# Patient Record
Sex: Female | Born: 1965 | Race: White | Hispanic: No | Marital: Married | Smoking: Never smoker
Health system: Southern US, Community
[De-identification: ages and names within clinical notes are randomized; demographics above are authoritative.]

## PROBLEM LIST (undated history)

## (undated) DIAGNOSIS — K219 Gastro-esophageal reflux disease without esophagitis: Secondary | ICD-10-CM

## (undated) HISTORY — PX: HERNIA REPAIR: SHX51

---

## 1998-05-17 ENCOUNTER — Inpatient Hospital Stay (HOSPITAL_COMMUNITY): Admission: AD | Admit: 1998-05-17 | Discharge: 1998-05-19 | Payer: Self-pay | Admitting: Obstetrics & Gynecology

## 1998-06-21 ENCOUNTER — Other Ambulatory Visit: Admission: RE | Admit: 1998-06-21 | Discharge: 1998-06-21 | Payer: Self-pay | Admitting: Obstetrics & Gynecology

## 1999-08-13 ENCOUNTER — Other Ambulatory Visit: Admission: RE | Admit: 1999-08-13 | Discharge: 1999-08-13 | Payer: Self-pay | Admitting: *Deleted

## 2002-12-22 ENCOUNTER — Other Ambulatory Visit: Admission: RE | Admit: 2002-12-22 | Discharge: 2002-12-22 | Payer: Self-pay | Admitting: *Deleted

## 2004-04-10 ENCOUNTER — Other Ambulatory Visit: Admission: RE | Admit: 2004-04-10 | Discharge: 2004-04-10 | Payer: Self-pay | Admitting: Internal Medicine

## 2005-04-14 ENCOUNTER — Other Ambulatory Visit: Admission: RE | Admit: 2005-04-14 | Discharge: 2005-04-14 | Payer: Self-pay | Admitting: Internal Medicine

## 2007-02-14 ENCOUNTER — Other Ambulatory Visit: Admission: RE | Admit: 2007-02-14 | Discharge: 2007-02-14 | Payer: Self-pay | Admitting: Family Medicine

## 2009-01-30 ENCOUNTER — Other Ambulatory Visit: Admission: RE | Admit: 2009-01-30 | Discharge: 2009-01-30 | Payer: Self-pay | Admitting: Family Medicine

## 2009-06-20 ENCOUNTER — Other Ambulatory Visit: Admission: RE | Admit: 2009-06-20 | Discharge: 2009-06-20 | Payer: Self-pay | Admitting: Radiology

## 2011-01-29 ENCOUNTER — Other Ambulatory Visit (HOSPITAL_COMMUNITY)
Admission: RE | Admit: 2011-01-29 | Discharge: 2011-01-29 | Disposition: A | Discharge status: Home / Self Care | Payer: BC Managed Care – PPO | Source: Ambulatory Visit | Attending: Internal Medicine | Admitting: Internal Medicine

## 2011-01-29 ENCOUNTER — Other Ambulatory Visit: Payer: Self-pay

## 2011-01-29 DIAGNOSIS — Z01419 Encounter for gynecological examination (general) (routine) without abnormal findings: Secondary | ICD-10-CM | POA: Insufficient documentation

## 2011-03-02 ENCOUNTER — Other Ambulatory Visit: Payer: Self-pay | Admitting: Obstetrics and Gynecology

## 2014-11-05 ENCOUNTER — Other Ambulatory Visit: Payer: Self-pay

## 2014-11-05 ENCOUNTER — Other Ambulatory Visit (HOSPITAL_COMMUNITY)
Admission: RE | Admit: 2014-11-05 | Discharge: 2014-11-05 | Disposition: A | Discharge status: Home / Self Care | Payer: BLUE CROSS/BLUE SHIELD | Source: Ambulatory Visit | Attending: Family Medicine | Admitting: Family Medicine

## 2014-11-05 DIAGNOSIS — Z01419 Encounter for gynecological examination (general) (routine) without abnormal findings: Secondary | ICD-10-CM | POA: Diagnosis not present

## 2014-11-07 LAB — CYTOLOGY - PAP

## 2018-03-17 ENCOUNTER — Other Ambulatory Visit: Payer: Self-pay | Admitting: Obstetrics and Gynecology

## 2018-03-17 ENCOUNTER — Other Ambulatory Visit (HOSPITAL_COMMUNITY)
Admission: RE | Admit: 2018-03-17 | Discharge: 2018-03-17 | Disposition: A | Discharge status: Home / Self Care | Payer: BC Managed Care – PPO | Source: Ambulatory Visit | Attending: Obstetrics and Gynecology | Admitting: Obstetrics and Gynecology

## 2018-03-17 DIAGNOSIS — Z01411 Encounter for gynecological examination (general) (routine) with abnormal findings: Secondary | ICD-10-CM | POA: Diagnosis present

## 2018-03-18 LAB — CYTOLOGY - PAP
Diagnosis: NEGATIVE
HPV: NOT DETECTED

## 2019-08-11 HISTORY — PX: WISDOM TOOTH EXTRACTION: SHX21

## 2020-08-10 HISTORY — PX: IMPACTED THIRD MOLAR REMOVAL: SHX1790

## 2020-10-10 ENCOUNTER — Ambulatory Visit
Admission: EM | Admit: 2020-10-10 | Discharge: 2020-10-10 | Disposition: A | Discharge status: Home / Self Care | Payer: BC Managed Care – PPO | Attending: Emergency Medicine | Admitting: Emergency Medicine

## 2020-10-10 ENCOUNTER — Other Ambulatory Visit: Payer: Self-pay

## 2020-10-10 ENCOUNTER — Encounter: Payer: Self-pay | Admitting: Emergency Medicine

## 2020-10-10 DIAGNOSIS — J028 Acute pharyngitis due to other specified organisms: Secondary | ICD-10-CM

## 2020-10-10 DIAGNOSIS — B9789 Other viral agents as the cause of diseases classified elsewhere: Secondary | ICD-10-CM | POA: Diagnosis not present

## 2020-10-10 HISTORY — DX: Gastro-esophageal reflux disease without esophagitis: K21.9

## 2020-10-10 LAB — POCT RAPID STREP A (OFFICE): Rapid Strep A Screen: NEGATIVE

## 2020-10-10 MED ORDER — LIDOCAINE VISCOUS HCL 2 % MT SOLN: 15.0000 mL | OROMUCOSAL | 1 refills | Status: DC | PRN

## 2020-10-10 NOTE — ED Triage Notes (Signed)
Sore throat started yesterday.  Started with sore muscles in neck in shoulders a couple of days ago.

## 2020-10-10 NOTE — Discharge Instructions (Addendum)
Strep test negative, will send out for culture and we will call you with results Get plenty of rest and push fluids Viscous lidocaine prescribed.  This is an oral solution you can swish, and gargle as needed for symptomatic relief of sore throat.  Do not exceed 8 doses in a 24 hour period.  Do not use prior to eating, as this will numb your entire mouth.   Drink warm or cool liquids, use throat lozenges, or popsicles to help alleviate symptoms Use throat lozenges such as Halls, Vicks to soothe throat Use OTC  Cepacol or Chloraseptic to numb throat Gargle with salty warm water daily Take OTC ibuprofen or tylenol as needed for pain Follow up with PCP if symptoms persists Return or go to ER if patient has any new or worsening symptoms such as fever, chills, nausea, vomiting, worsening sore throat, cough, abdominal pain, chest pain, changes in bowel or bladder habits, etc..Marland Kitchen

## 2020-10-10 NOTE — ED Provider Notes (Signed)
[facility]   [id number] 10/10/20 Arrival Time: 0940  TG:YBWL THROAT  SUBJECTIVE: History from: patient.  Lacey Morse is a 55 y.o. female who presented to the urgent care with a complaint of sore throat and neck pain shoulder pain that started yesterday.  Denies sick exposure to COVID, strep, flu or mono, or precipitating event.  Has tried OTC medication without relief.  Symptoms are made worse with swallowing, but tolerating liquids and own secretions without difficulty.  Denies previous symptoms in the past.   Denies fever, chills, fatigue, ear pain, sinus pain, rhinorrhea, nasal congestion, cough, SOB, wheezing, chest pain, nausea, rash, changes in bowel or bladder habits.      ROS: As per HPI.  All other pertinent ROS negative.     Past Medical History:  Diagnosis Date  . GERD (gastroesophageal reflux disease)    Past Surgical History:  Procedure Laterality Date  . HERNIA REPAIR     No Known Allergies No current facility-administered medications on file prior to encounter.   Current Outpatient Medications on File Prior to Encounter  Medication Sig Dispense Refill  . omeprazole (PRILOSEC) 20 MG capsule Take 20 mg by mouth daily.     Social History   Socioeconomic History  . Marital status: Married    Spouse name: Not on file  . Number of children: Not on file  . Years of education: Not on file  . Highest education level: Not on file  Occupational History  . Not on file  Tobacco Use  . Smoking status: Never Smoker  . Smokeless tobacco: Never Used  Substance and Sexual Activity  . Alcohol use: Never  . Drug use: Never  . Sexual activity: Not on file  Other Topics Concern  . Not on file  Social History Narrative  . Not on file   Social Determinants of Health   Financial Resource Strain: Not on file  Food Insecurity: Not on file  Transportation Needs: Not on file  Physical Activity: Not on file  Stress: Not on file  Social Connections: Not on  file  Intimate Partner Violence: Not on file   History reviewed. No pertinent family history.  OBJECTIVE:  Vitals:   10/10/20 0953 10/10/20 0955  BP:  115/70  Pulse:  89  Resp:  18  Temp:  98.4 F (36.9 C)  TempSrc:  Oral  SpO2:  96%  Weight: 128 lb (58.1 kg)   Height: 5\' 2"  (1.575 m)      General appearance: alert; appears fatigued, but nontoxic, speaking in full sentences and managing own secretions HEENT: NCAT; Ears: EACs clear, TMs pearly gray with visible cone of light, without erythema; Eyes: PERRL, EOMI grossly; Nose: no obvious rhinorrhea; Throat: oropharynx clear, tonsils 1+ and mildly erythematous without white tonsillar exudates, uvula midline Neck: supple without LAD Lungs: CTA bilaterally without adventitious breath sounds; cough absent Heart: regular rate and rhythm.  Radial pulses 2+ symmetrical bilaterally Skin: warm and dry Psychological: alert and cooperative; normal mood and affect  LABS: Results for orders placed or performed during the hospital encounter of 10/10/20 (from the past 24 hour(s))  POCT rapid strep A     Status: None   Collection Time: 10/10/20 10:05 AM  Result Value Ref Range   Rapid Strep A Screen Negative Negative     ASSESSMENT & PLAN:  1. Sore throat (viral)     Meds ordered this encounter  Medications  . lidocaine (XYLOCAINE) 2 % solution    Sig: Use  as directed 15 mLs in the mouth or throat as needed for mouth pain.    Dispense:  100 mL    Refill:  1    Discharge instructions  Strep test negative, will send out for culture and we will call you with results Get plenty of rest and push fluids Viscous lidocaine prescribed.  This is an oral solution you can swish, and gargle as needed for symptomatic relief of sore throat.  Do not exceed 8 doses in a 24 hour period.  Do not use prior to eating, as this will numb your entire mouth.   Drink warm or cool liquids, use throat lozenges, or popsicles to help alleviate symptoms Use  throat lozenges such as Halls, Vicks to soothe throat Use OTC  Cepacol or Chloraseptic to numb throat Gargle with salty warm water daily Take OTC ibuprofen or tylenol as needed for pain Follow up with PCP if symptoms persists Return or go to ER if patient has any new or worsening symptoms such as fever, chills, nausea, vomiting, worsening sore throat, cough, abdominal pain, chest pain, changes in bowel or bladder habits, etc...  Reviewed expectations re: course of current medical issues. Questions answered. Outlined signs and symptoms indicating need for more acute intervention. Patient verbalized understanding. After Visit Summary given.         Durward Parcel, FNP 10/10/20 1017

## 2020-10-13 LAB — CULTURE, GROUP A STREP (THRC)

## 2021-04-17 ENCOUNTER — Ambulatory Visit (HOSPITAL_COMMUNITY): Payer: BC Managed Care – PPO | Attending: Sports Medicine | Admitting: Occupational Therapy

## 2021-04-17 ENCOUNTER — Other Ambulatory Visit: Payer: Self-pay

## 2021-04-17 ENCOUNTER — Encounter (HOSPITAL_COMMUNITY): Payer: Self-pay | Admitting: Occupational Therapy

## 2021-04-17 DIAGNOSIS — M25512 Pain in left shoulder: Secondary | ICD-10-CM | POA: Diagnosis present

## 2021-04-17 DIAGNOSIS — M25612 Stiffness of left shoulder, not elsewhere classified: Secondary | ICD-10-CM

## 2021-04-17 DIAGNOSIS — G8929 Other chronic pain: Secondary | ICD-10-CM | POA: Diagnosis present

## 2021-04-17 DIAGNOSIS — R29898 Other symptoms and signs involving the musculoskeletal system: Secondary | ICD-10-CM | POA: Insufficient documentation

## 2021-04-17 NOTE — Patient Instructions (Signed)
  1) Flexion Wall Stretch    Face wall, place affected handon wall in front of you. Slide hand up the wall  and lean body in towards the wall. Hold for 10 seconds. Repeat 3-5 times. 1-2 times/day.     2) Towel Stretch with Internal Rotation   Or     Gently pull up (or to the side) your affected arm  behind your back with the assist of a towel. Hold 10 seconds, repeat 3-5 times. 1-2 times/day.             3) Corner Stretch    Stand at a corner of a wall, place your arms on the walls with elbows bent. Lean into the corner until a stretch is felt along the front of your chest and/or shoulders. Hold for 10 seconds. Repeat 3-5X, 1-2 times/day.    4) Posterior Capsule Stretch    Bring the involved arm across chest. Grasp elbow and pull toward chest until you feel a stretch in the back of the upper arm and shoulder. Hold 10 seconds. Repeat 3-5X. Complete 1-2 times/day.    5) Scapular Retraction    Tuck chin back as you pinch shoulder blades together.  Hold 5 seconds. Repeat 3-5X. Complete 1-2 times/day.    6) External Rotation Stretch:     Place your affected hand on the wall with the elbow bent and gently turn your body the opposite direction until a stretch is felt. Hold 10 seconds, repeat 3-5X. Complete 1-2 times/day.    7) Shoulder Abduction stretch:     Slide your hand up the wall as far as you can, then take a step closer to the wall to slide it further. Hold briefly, then slowly lower it back down and repeat. Hold 10 seconds, repeat 3-5X. Complete 1-2 times/day.

## 2021-04-17 NOTE — Therapy (Signed)
[facility] [address] [place], [place], [address] Phone: [phone]   Fax:  [phone]  Occupational Therapy Evaluation  Patient Details  Name: Lacey Morse MRN: [id number] Date of Birth: 1966/01/04 Referring Provider (OT): Dr. Christena Deem   Encounter Date: 04/17/2021   OT End of Session - 04/17/21 0940     Visit Number 1    Number of Visits 4    Date for OT Re-Evaluation 05/17/21    Authorization Type [facility], $25 copay    Authorization Time Period no visit limit    OT Start Time 0904    OT Stop Time 0938    OT Time Calculation (min) 34 min    Activity Tolerance Patient tolerated treatment well    Behavior During Therapy [facility] for tasks assessed/performed             Past Medical History:  Diagnosis Date   GERD (gastroesophageal reflux disease)     Past Surgical History:  Procedure Laterality Date   HERNIA REPAIR      There were no vitals filed for this visit.   Subjective Assessment - 04/17/21 0929     Subjective  S: I've been doing the exercises the doctor gave me.    Pertinent History Pt is a 55 y/o female presenting with left shoulder pain that began in February 2022. Pt reports MD believes she could be developing frozen shoulder and she received a cortisone shot in May, however did not have relief from the shot. Pt was referred to occupational therapy for evaluation and treatment by Dr. Christena Deem.    Special Tests FOTO: 58/100    Patient Stated Goals To have less pain in my arm.    Currently in Pain? No/denies   has pain with quick movements              [facility] OT Assessment - 04/17/21 0856       Assessment   Medical Diagnosis left shoulder pain    Referring Provider (OT) Dr. Christena Deem    Onset Date/Surgical Date 09/10/20    Hand Dominance Right    Prior Therapy None for this issue      Precautions   Precautions None      Restrictions   Weight Bearing Restrictions No       Balance Screen   Has the patient fallen in the past 6 months Yes    How many times? 1    Has the patient had a decrease in activity level because of a fear of falling?  No    Is the patient reluctant to leave their home because of a fear of falling?  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 Lifting weighted items is difficult at times. Sleeps on the sofa due to discomfort      Observation/Other Assessments   Focus on Therapeutic Outcomes (FOTO)  58/100      ROM / Strength   AROM / PROM / Strength AROM;Strength;PROM      Palpation   Palpation comment mod fascial restrictions along upper arm, trapezius, and scapular regions      AROM   Overall AROM Comments Assessed seated,  er/IR adducted    AROM Assessment Site Shoulder    Right/Left Shoulder Left    Left Shoulder Flexion 151 Degrees    Left Shoulder ABduction 102 Degrees    Left Shoulder Internal Rotation 90 Degrees    Left Shoulder External Rotation 63 Degrees      PROM   Overall PROM Comments Assessed supine, er/IR adducted    PROM Assessment Site Shoulder    Right/Left Shoulder Left    Left Shoulder Flexion 153 Degrees    Left Shoulder ABduction 180 Degrees    Left Shoulder Internal Rotation 90 Degrees    Left Shoulder External Rotation 65 Degrees      Strength   Overall Strength Comments Assessed seated, er/IR adducted    Strength Assessment Site Shoulder    Right/Left Shoulder Left    Left Shoulder Flexion 5/5    Left Shoulder ABduction 5/5    Left Shoulder Internal Rotation 5/5    Left Shoulder External Rotation 5/5                              OT Education - 04/17/21 0939     Education Details shoulder stretches    Person(s) Educated  Patient    Methods Explanation;Demonstration;Handout    Comprehension Verbalized understanding;Returned demonstration              OT Short Term Goals - 04/17/21 0943       OT SHORT TERM GOAL #1   Title Pt will be provided with and educated on HEP to improve mobility of LUE required for use during ADL tasks.    Time 4    Period Weeks    Status New    Target Date 05/17/21      OT SHORT TERM GOAL #2   Title Pt will decrease pain in LUE to 2/10 or less to improve ability to sleep in the bed versus on the sofa.    Time 4    Period Weeks    Status New      OT SHORT TERM GOAL #3   Title Pt will decrease fascial restrictions in LUE to min amounts to improve mobility required for functional reaching tasks.    Time 4    Period Weeks    Status New      OT SHORT TERM GOAL #4   Title Pt will increase LUE A/ROM to WNL to improve ability to perform dressing and bathing tasks without compensatory techniques.    Time 4    Period Weeks    Status New                      Plan - 04/17/21 0940     Clinical Impression Statement A: Pt is a 55 y/o female presenting with left shoulder pain present since February 2022, presents with limitations on functional use of LUE during ADLs and work tasks. Pt reports diligence in HEP completion from MD, she is using her arm for ADLs and trying to keep it mobile.    OT Occupational Profile and History Problem Focused Assessment - Including review of records relating to presenting problem    Occupational performance deficits (Please refer to evaluation for details): ADL's;IADL's;Rest and Sleep;Work;Leisure    Body Structure / Function / Physical Skills ADL;Endurance;UE functional use;Fascial restriction;Pain;ROM;IADL;Strength    Rehab Potential Good    Clinical Decision Making Limited treatment options, no task modification necessary  Comorbidities Affecting Occupational Performance: None    Modification or Assistance to Complete Evaluation   No modification of tasks or assist necessary to complete eval    OT Frequency 1x / week    OT Duration 4 weeks    OT Treatment/Interventions Self-care/ADL training;Ultrasound;DME and/or AE instruction;Patient/family education;Passive range of motion;Cryotherapy;Electrical Stimulation;Moist Heat;Therapeutic exercise;Manual Therapy;Therapeutic activities    Plan P: Pt will benefit from skilled OT services to decrease pain and fascial restrictions, increase joint ROM, strength, and functional use of LUE. Treatment plan: myofascial release, manual techniques, P/ROM, A/ROM, general LUE strengthening, scapular mobility/stability/strengthening, modalities prn    OT Home Exercise Plan eval: shoulder stretches    Consulted and Agree with Plan of Care Patient             Patient will benefit from skilled therapeutic intervention in order to improve the following deficits and impairments:   Body Structure / Function / Physical Skills: ADL, Endurance, UE functional use, Fascial restriction, Pain, ROM, IADL, Strength       Visit Diagnosis: Chronic left shoulder pain  Other symptoms and signs involving the musculoskeletal system  Stiffness of left shoulder, not elsewhere classified    Problem List There are no problems to display for this patient.   Ezra Sites, OTR/L  [phone] 04/17/2021, 9:49 AM  [facility] [address] [place], [place], [address] Phone: [phone]   Fax:  [phone]  Name: Lacey Morse MRN: [id number] Date of Birth: Jul 14, 1966

## 2021-04-22 ENCOUNTER — Other Ambulatory Visit: Payer: Self-pay | Admitting: Sports Medicine

## 2021-04-22 ENCOUNTER — Ambulatory Visit: Payer: BC Managed Care – PPO | Admitting: Neurology

## 2021-04-22 ENCOUNTER — Ambulatory Visit
Admission: RE | Admit: 2021-04-22 | Discharge: 2021-04-22 | Disposition: A | Discharge status: Home / Self Care | Payer: BC Managed Care – PPO | Source: Ambulatory Visit | Attending: Sports Medicine | Admitting: Sports Medicine

## 2021-04-22 DIAGNOSIS — M25512 Pain in left shoulder: Secondary | ICD-10-CM

## 2021-04-24 ENCOUNTER — Encounter (HOSPITAL_COMMUNITY): Payer: Self-pay | Admitting: Occupational Therapy

## 2021-04-24 ENCOUNTER — Ambulatory Visit (HOSPITAL_COMMUNITY): Payer: BC Managed Care – PPO | Admitting: Occupational Therapy

## 2021-04-24 ENCOUNTER — Other Ambulatory Visit: Payer: Self-pay

## 2021-04-24 DIAGNOSIS — M25612 Stiffness of left shoulder, not elsewhere classified: Secondary | ICD-10-CM

## 2021-04-24 DIAGNOSIS — R29898 Other symptoms and signs involving the musculoskeletal system: Secondary | ICD-10-CM

## 2021-04-24 DIAGNOSIS — M25512 Pain in left shoulder: Secondary | ICD-10-CM

## 2021-04-24 DIAGNOSIS — G8929 Other chronic pain: Secondary | ICD-10-CM

## 2021-04-24 NOTE — Therapy (Signed)
[facility] [address] [place], [place], [address] Phone: [phone]   Fax:  [phone]  Occupational Therapy Treatment  Patient Details  Name: Lacey Morse MRN: [id number] Date of Birth: 25-Feb-1966 Referring Provider (OT): Dr. Christena Deem   Encounter Date: 04/24/2021   OT End of Session - 04/24/21 1202     Visit Number 2    Number of Visits 4    Date for OT Re-Evaluation 05/17/21    Authorization Type [facility], $25 copay    Authorization Time Period no visit limit    OT Start Time 1119    OT Stop Time 1159    OT Time Calculation (min) 40 min    Activity Tolerance Patient tolerated treatment well    Behavior During Therapy [facility] for tasks assessed/performed             Past Medical History:  Diagnosis Date   GERD (gastroesophageal reflux disease)     Past Surgical History:  Procedure Laterality Date   HERNIA REPAIR      There were no vitals filed for this visit.   Subjective Assessment - 04/24/21 1121     Subjective  S: I can tell a big difference in my ROM.    Currently in Pain? No/denies                [facility] OT Assessment - 04/24/21 1120       Assessment   Medical Diagnosis left shoulder pain      Precautions   Precautions None                      OT Treatments/Exercises (OP) - 04/24/21 1121       Exercises   Exercises Shoulder      Shoulder Exercises: Supine   Protraction PROM;5 reps;AROM;10 reps    Horizontal ABduction PROM;5 reps;AROM;10 reps    External Rotation PROM;5 reps;AROM;10 reps    Internal Rotation PROM;5 reps;AROM;10 reps    Flexion PROM;5 reps;AROM;10 reps    ABduction PROM;5 reps;AROM;10 reps      Shoulder Exercises: Standing   Extension Theraband;10 reps    Theraband Level (Shoulder Extension) Level 2 (Red)    Row Theraband;10 reps    Theraband Level (Shoulder Row) Level 2 (Red)    Retraction Theraband;10 reps    Theraband Level (Shoulder  Retraction) Level 2 (Red)      Shoulder Exercises: ROM/Strengthening   UBE (Upper Arm Bike) Level 1 3' forward 3' reverse, pace: 5.5    Over Head Lace 2' seated      Manual Therapy   Manual Therapy Myofascial release    Manual therapy comments completed separately from therapeutic exercises    Myofascial Release myofascial release to left upper arm, trapezius, and scapular region to decrease pain and fascial restrictions and increase joint ROM.                      OT Short Term Goals - 04/24/21 1135       OT SHORT TERM GOAL #1   Title Pt will be provided with and educated on HEP to improve mobility of LUE required for use during ADL tasks.    Time 4    Period Weeks    Status On-going    Target Date 05/17/21      OT SHORT TERM GOAL #2   Title Pt will decrease pain in LUE to 2/10 or less  to improve ability to sleep in the bed versus on the sofa.    Time 4    Period Weeks    Status On-going      OT SHORT TERM GOAL #3   Title Pt will decrease fascial restrictions in LUE to min amounts to improve mobility required for functional reaching tasks.    Time 4    Period Weeks    Status On-going      OT SHORT TERM GOAL #4   Title Pt will increase LUE A/ROM to WNL to improve ability to perform dressing and bathing tasks without compensatory techniques.    Time 4    Period Weeks    Status On-going                      Plan - 04/24/21 1157     Clinical Impression Statement A: Pt reports greater ease completing dressing tasks, reaching tasks at home since evaluation. Pt with great improvement in ROM, WFL in all ranges. Initiated myofascial release to address fascial restrictions today, A/ROM in supine, scapular theraband, overhead lacing, and UBE. Verbal cuing for form and technique.    Body Structure / Function / Physical Skills ADL;Endurance;UE functional use;Fascial restriction;Pain;ROM;IADL;Strength    Plan P: Follow up on HEP, complete A/ROM in standing,  functional reaching task    OT Home Exercise Plan eval: shoulder stretches; 9/15: red scapular theraband    Consulted and Agree with Plan of Care Patient             Patient will benefit from skilled therapeutic intervention in order to improve the following deficits and impairments:   Body Structure / Function / Physical Skills: ADL, Endurance, UE functional use, Fascial restriction, Pain, ROM, IADL, Strength       Visit Diagnosis: Chronic left shoulder pain  Other symptoms and signs involving the musculoskeletal system  Stiffness of left shoulder, not elsewhere classified    Problem List There are no problems to display for this patient.   Ezra Sites, OTR/L  [phone] 04/24/2021, 12:03 PM  [facility] [address] [place], [place], [address] Phone: [phone]   Fax:  [phone]  Name: Lacey Morse MRN: [id number] Date of Birth: 02-06-1966

## 2021-04-24 NOTE — Patient Instructions (Signed)

## 2021-05-01 ENCOUNTER — Encounter (HOSPITAL_COMMUNITY): Payer: Self-pay | Admitting: Occupational Therapy

## 2021-05-01 ENCOUNTER — Ambulatory Visit (HOSPITAL_COMMUNITY): Payer: BC Managed Care – PPO | Admitting: Occupational Therapy

## 2021-05-01 ENCOUNTER — Other Ambulatory Visit: Payer: Self-pay

## 2021-05-01 DIAGNOSIS — M25512 Pain in left shoulder: Secondary | ICD-10-CM | POA: Diagnosis not present

## 2021-05-01 DIAGNOSIS — G8929 Other chronic pain: Secondary | ICD-10-CM

## 2021-05-01 DIAGNOSIS — R29898 Other symptoms and signs involving the musculoskeletal system: Secondary | ICD-10-CM

## 2021-05-01 DIAGNOSIS — M25612 Stiffness of left shoulder, not elsewhere classified: Secondary | ICD-10-CM

## 2021-05-01 NOTE — Therapy (Signed)
[facility] [address] [place], [place], [address] Phone: [phone]   Fax:  [phone]  Occupational Therapy Treatment  Patient Details  Name: Lacey Morse MRN: [id number] Date of Birth: April 18, 1966 Referring Provider (OT): Dr. Christena Deem   Encounter Date: 05/01/2021   OT End of Session - 05/01/21 1156     Visit Number 3    Number of Visits 4    Date for OT Re-Evaluation 05/17/21    Authorization Type [facility], $25 copay    Authorization Time Period no visit limit    OT Start Time 1117    OT Stop Time 1155    OT Time Calculation (min) 38 min    Activity Tolerance Patient tolerated treatment well    Behavior During Therapy [facility] for tasks assessed/performed             Past Medical History:  Diagnosis Date   GERD (gastroesophageal reflux disease)     Past Surgical History:  Procedure Laterality Date   HERNIA REPAIR      There were no vitals filed for this visit.   Subjective Assessment - 05/01/21 1118     Subjective  S: I'm a little sore but I'm using it more.    Currently in Pain? Yes    Pain Score 4     Pain Location Shoulder    Pain Orientation Left    Pain Descriptors / Indicators Aching;Sore    Pain Type Acute pain    Pain Radiating Towards None    Pain Onset In the past 7 days    Pain Frequency Intermittent    Aggravating Factors  using it more    Pain Relieving Factors OTC pain medication    Effect of Pain on Daily Activities min effect on ADLs    Multiple Pain Sites No                [facility] OT Assessment - 05/01/21 1117       Assessment   Medical Diagnosis left shoulder pain      Precautions   Precautions None                      OT Treatments/Exercises (OP) - 05/01/21 1119       Exercises   Exercises Shoulder      Shoulder Exercises: Supine   Protraction PROM;5 reps    Horizontal ABduction PROM;5 reps    External Rotation PROM;5 reps    Internal  Rotation PROM;5 reps    Flexion PROM;5 reps    ABduction PROM;5 reps      Shoulder Exercises: Standing   Protraction Strengthening;10 reps    Protraction Weight (lbs) 1    Horizontal ABduction Strengthening;10 reps    Horizontal ABduction Weight (lbs) 1    External Rotation Strengthening;10 reps    External Rotation Weight (lbs) 1    Internal Rotation Strengthening;10 reps    Internal Rotation Weight (lbs) 1    Flexion Strengthening;10 reps    Shoulder Flexion Weight (lbs) 1    ABduction Strengthening;10 reps    Shoulder ABduction Weight (lbs) 1      Shoulder Exercises: Therapy Ball   Other Therapy Ball Exercises green therapy ball: chest press, flexion, overhead press, circles each direction, 10X each      Shoulder Exercises: ROM/Strengthening   UBE (Upper Arm Bike) Level 2 3' forward 3' reverse pace: 7.0    X to V Arms 10X  Proximal Shoulder Strengthening, Seated 10X each , no rest breaks    Ball on Wall 1' flexion 1' abduction      Manual Therapy   Manual Therapy Myofascial release    Manual therapy comments completed separately from therapeutic exercises    Myofascial Release myofascial release to left upper arm, trapezius, and scapular region to decrease pain and fascial restrictions and increase joint ROM.                      OT Short Term Goals - 04/24/21 1135       OT SHORT TERM GOAL #1   Title Pt will be provided with and educated on HEP to improve mobility of LUE required for use during ADL tasks.    Time 4    Period Weeks    Status On-going    Target Date 05/17/21      OT SHORT TERM GOAL #2   Title Pt will decrease pain in LUE to 2/10 or less to improve ability to sleep in the bed versus on the sofa.    Time 4    Period Weeks    Status On-going      OT SHORT TERM GOAL #3   Title Pt will decrease fascial restrictions in LUE to min amounts to improve mobility required for functional reaching tasks.    Time 4    Period Weeks    Status  On-going      OT SHORT TERM GOAL #4   Title Pt will increase LUE A/ROM to WNL to improve ability to perform dressing and bathing tasks without compensatory techniques.    Time 4    Period Weeks    Status On-going                      Plan - 05/01/21 1137     Clinical Impression Statement A: Pt reports soreness today, has been completing HEP and using her LUE more during the day. Continued with myofascial release to address fascial restrictions, passive stretching. Progressed to strengthening in standing using 1# weights, added therapy ball strengthening and ball on wall. Increased UBE to level 2. Verbal cuing for form and technique.    Body Structure / Function / Physical Skills ADL;Endurance;UE functional use;Fascial restriction;Pain;ROM;IADL;Strength    Plan P: Reassessment, discharge with HEP    OT Home Exercise Plan eval: shoulder stretches; 9/15: red scapular theraband    Consulted and Agree with Plan of Care Patient             Patient will benefit from skilled therapeutic intervention in order to improve the following deficits and impairments:   Body Structure / Function / Physical Skills: ADL, Endurance, UE functional use, Fascial restriction, Pain, ROM, IADL, Strength       Visit Diagnosis: Chronic left shoulder pain  Other symptoms and signs involving the musculoskeletal system  Stiffness of left shoulder, not elsewhere classified    Problem List There are no problems to display for this patient.  Ezra Sites, OTR/L  [phone] 05/01/2021, 11:57 AM  [facility] [address] [place], [place], [address] Phone: [phone]   Fax:  [phone]  Name: ADRIANE GABBERT MRN: [id number] Date of Birth: July 22, 1966

## 2021-05-08 ENCOUNTER — Other Ambulatory Visit: Payer: Self-pay

## 2021-05-08 ENCOUNTER — Ambulatory Visit (HOSPITAL_COMMUNITY): Payer: BC Managed Care – PPO | Admitting: Occupational Therapy

## 2021-05-08 ENCOUNTER — Encounter (HOSPITAL_COMMUNITY): Payer: Self-pay | Admitting: Occupational Therapy

## 2021-05-08 DIAGNOSIS — R29898 Other symptoms and signs involving the musculoskeletal system: Secondary | ICD-10-CM

## 2021-05-08 DIAGNOSIS — G8929 Other chronic pain: Secondary | ICD-10-CM

## 2021-05-08 DIAGNOSIS — M25512 Pain in left shoulder: Secondary | ICD-10-CM | POA: Diagnosis not present

## 2021-05-08 DIAGNOSIS — M25612 Stiffness of left shoulder, not elsewhere classified: Secondary | ICD-10-CM

## 2021-05-08 NOTE — Patient Instructions (Signed)

## 2021-05-08 NOTE — Therapy (Signed)
[place], [place], [address] Phone: [phone]   Fax:  [phone]  Occupational Therapy Reassessment, Treatment Discharge Summary  Patient Details  Name: Lacey Morse MRN: [id number] Date of Birth: 1966-07-17 Referring Provider (OT): Dr. Inez Catalina   Encounter Date: 05/08/2021   OT End of Session - 05/08/21 1144     Visit Number 4    Number of Visits 4    Date for OT Re-Evaluation 05/17/21    Authorization Type [facility], $25 copay    Authorization Time Period no visit limit    OT Start Time 1119    OT Stop Time 1149    OT Time Calculation (min) 30 min    Activity Tolerance Patient tolerated treatment well    Behavior During Therapy [facility] for tasks assessed/performed             Past Medical History:  Diagnosis Date   GERD (gastroesophageal reflux disease)     Past Surgical History:  Procedure Laterality Date   HERNIA REPAIR      There were no vitals filed for this visit.   Subjective Assessment - 05/08/21 1119     Subjective  S: I can do so much more than I could before.    Currently in Pain? No/denies                [facility] OT Assessment - 05/08/21 1118       Assessment   Medical Diagnosis left shoulder pain      Precautions   Precautions None      Observation/Other Assessments   Focus on Therapeutic Outcomes (FOTO)  63/100   58/100 previous     Palpation   Palpation comment min fascial restrictions along trapezius and scapular regions      AROM   Overall AROM Comments Assessed seated, er/IR adducted    AROM Assessment Site Shoulder    Right/Left Shoulder Left    Left Shoulder Flexion 160 Degrees   151 previous   Left Shoulder ABduction 180 Degrees   102 previous   Left Shoulder Internal Rotation 90 Degrees   same as previous   Left Shoulder External Rotation 65 Degrees   63 previous     PROM   Overall PROM Comments Assessed supine, er/IR adducted    PROM  Assessment Site Shoulder    Right/Left Shoulder Left    Left Shoulder Flexion 155 Degrees   153 previous   Left Shoulder ABduction 180 Degrees   same as previous   Left Shoulder Internal Rotation 90 Degrees   same as previous   Left Shoulder External Rotation 65 Degrees   same as previous     Strength   Overall Strength Comments Assessed seated, er/IR adducted    Strength Assessment Site Shoulder    Right/Left Shoulder Left    Left Shoulder Flexion 5/5   same as previous   Left Shoulder ABduction 5/5   same as previous   Left Shoulder Internal Rotation 5/5   same as previous   Left Shoulder External Rotation 5/5   same as previous                     OT Treatments/Exercises (OP) - 05/08/21 1121       Exercises   Exercises Shoulder      Shoulder Exercises: Standing   Protraction Strengthening;10 reps    Protraction Weight (lbs) 2    Horizontal ABduction  Strengthening;10 reps    Horizontal ABduction Weight (lbs) 2    External Rotation Strengthening;10 reps    External Rotation Weight (lbs) 2    Internal Rotation Strengthening;10 reps    Internal Rotation Weight (lbs) 2    Flexion Strengthening;10 reps    Shoulder Flexion Weight (lbs) 2    ABduction Strengthening;10 reps    Shoulder ABduction Weight (lbs) 2      Manual Therapy   Manual Therapy Myofascial release    Manual therapy comments completed separately from therapeutic exercises    Myofascial Release myofascial release to left upper arm, trapezius, and scapular region to decrease pain and fascial restrictions and increase joint ROM.                    OT Education - 05/08/21 1138     Education Details shoulder strengthening    Person(s) Educated Patient    Methods Explanation;Demonstration;Handout    Comprehension Verbalized understanding;Returned demonstration              OT Short Term Goals - 05/08/21 1136       OT SHORT TERM GOAL #1   Title Pt will be provided with and  educated on HEP to improve mobility of LUE required for use during ADL tasks.    Time 4    Period Weeks    Status Achieved    Target Date 05/17/21      OT SHORT TERM GOAL #2   Title Pt will decrease pain in LUE to 2/10 or less to improve ability to sleep in the bed versus on the sofa.    Time 4    Period Weeks    Status Achieved      OT SHORT TERM GOAL #3   Title Pt will decrease fascial restrictions in LUE to min amounts to improve mobility required for functional reaching tasks.    Time 4    Period Weeks    Status Achieved      OT SHORT TERM GOAL #4   Title Pt will increase LUE A/ROM to WNL to improve ability to perform dressing and bathing tasks without compensatory techniques.    Time 4    Period Weeks    Status Achieved                      Plan - 05/08/21 1141     Clinical Impression Statement A: Reassessment completed this session, pt has made great progress and has met all goals. Pt reports she is able to use her LUE for all ADLs, occasionally has pain if moving her arm to the extreme in certain planes. Pt is now able to sleep without pain, is attempting to transition back to the bed from the sofa. Pt has returned to the gym and is using all machines except the push-up machine. Progressed to 2# free weight strengthening today, updated HEP. Pt is agreeable to discharge today.    Body Structure / Function / Physical Skills ADL;Endurance;UE functional use;Fascial restriction;Pain;ROM;IADL;Strength    Plan P: Discharge pt    OT Home Exercise Plan eval: shoulder stretches; 9/15: red scapular theraband; 9/29: shoulder strengthening    Consulted and Agree with Plan of Care Patient             Patient will benefit from skilled therapeutic intervention in order to improve the following deficits and impairments:   Body Structure / Function / Physical Skills: ADL, Endurance, UE functional use, Fascial restriction,  Pain, ROM, IADL, Strength       Visit  Diagnosis: Chronic left shoulder pain  Other symptoms and signs involving the musculoskeletal system  Stiffness of left shoulder, not elsewhere classified    Problem List There are no problems to display for this patient.   Guadelupe Sabin, OTR/L  [phone] 05/08/2021, 11:52 AM  [place], [place], [address] Phone: [phone]   Fax:  [phone]  Name: Lacey Morse MRN: [id number] Date of Birth: [birth year]  OCCUPATIONAL THERAPY DISCHARGE SUMMARY  Visits from Start of Care: 4  Current functional level related to goals / functional outcomes: See above. Pt has met all goals and is ready to discharge with HEP.   Remaining deficits: Pain at end ranges of movement/stretches   Education / Equipment: HEP for shoulder stretching and strengthening   Patient agrees to discharge. Patient goals were met. Patient is being discharged due to meeting the stated rehab goals.Marland Kitchen

## 2022-02-01 IMAGING — DX DG SHOULDER 2+V*L*
3 series · 3 of 3 positions shown · non-contrast
Comparison: None.

CLINICAL DATA: Shoulder pain

EXAM:
LEFT SHOULDER - 2+ VIEW

[dg shoulder left (1 of 3)]
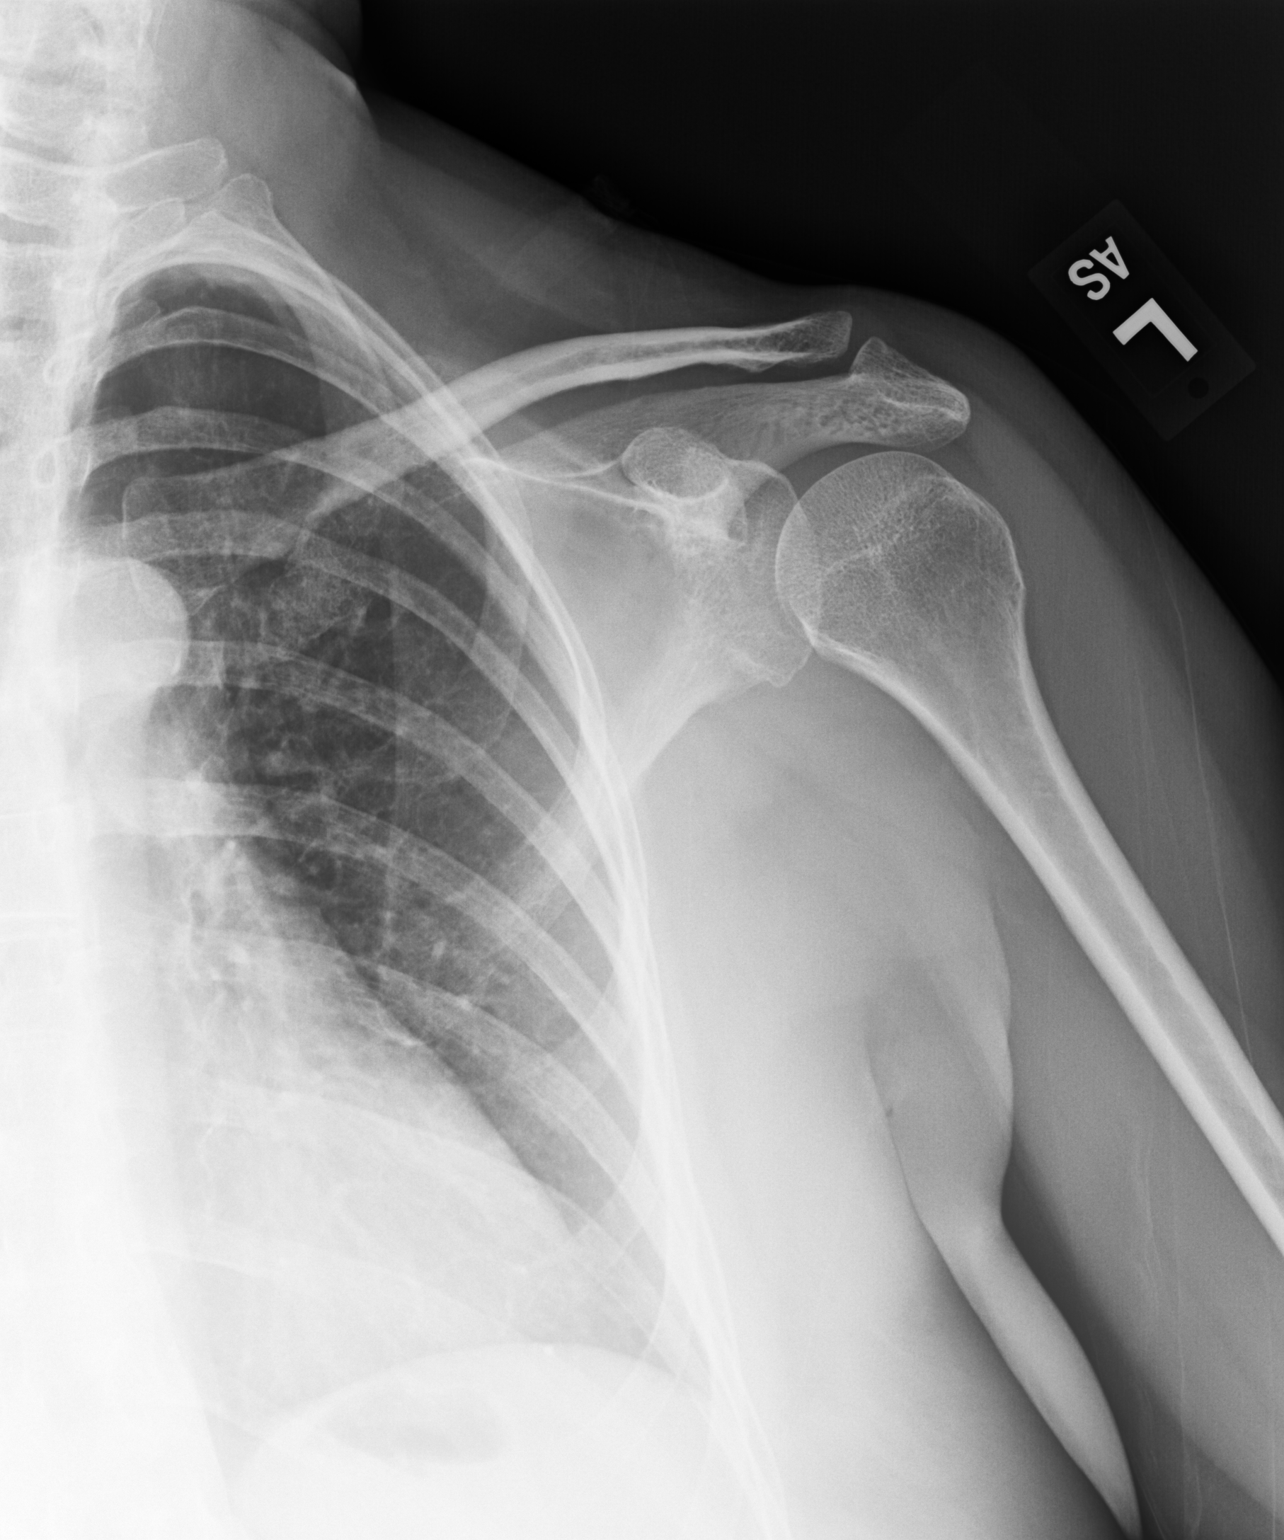

[dg shoulder left (2 of 3)]
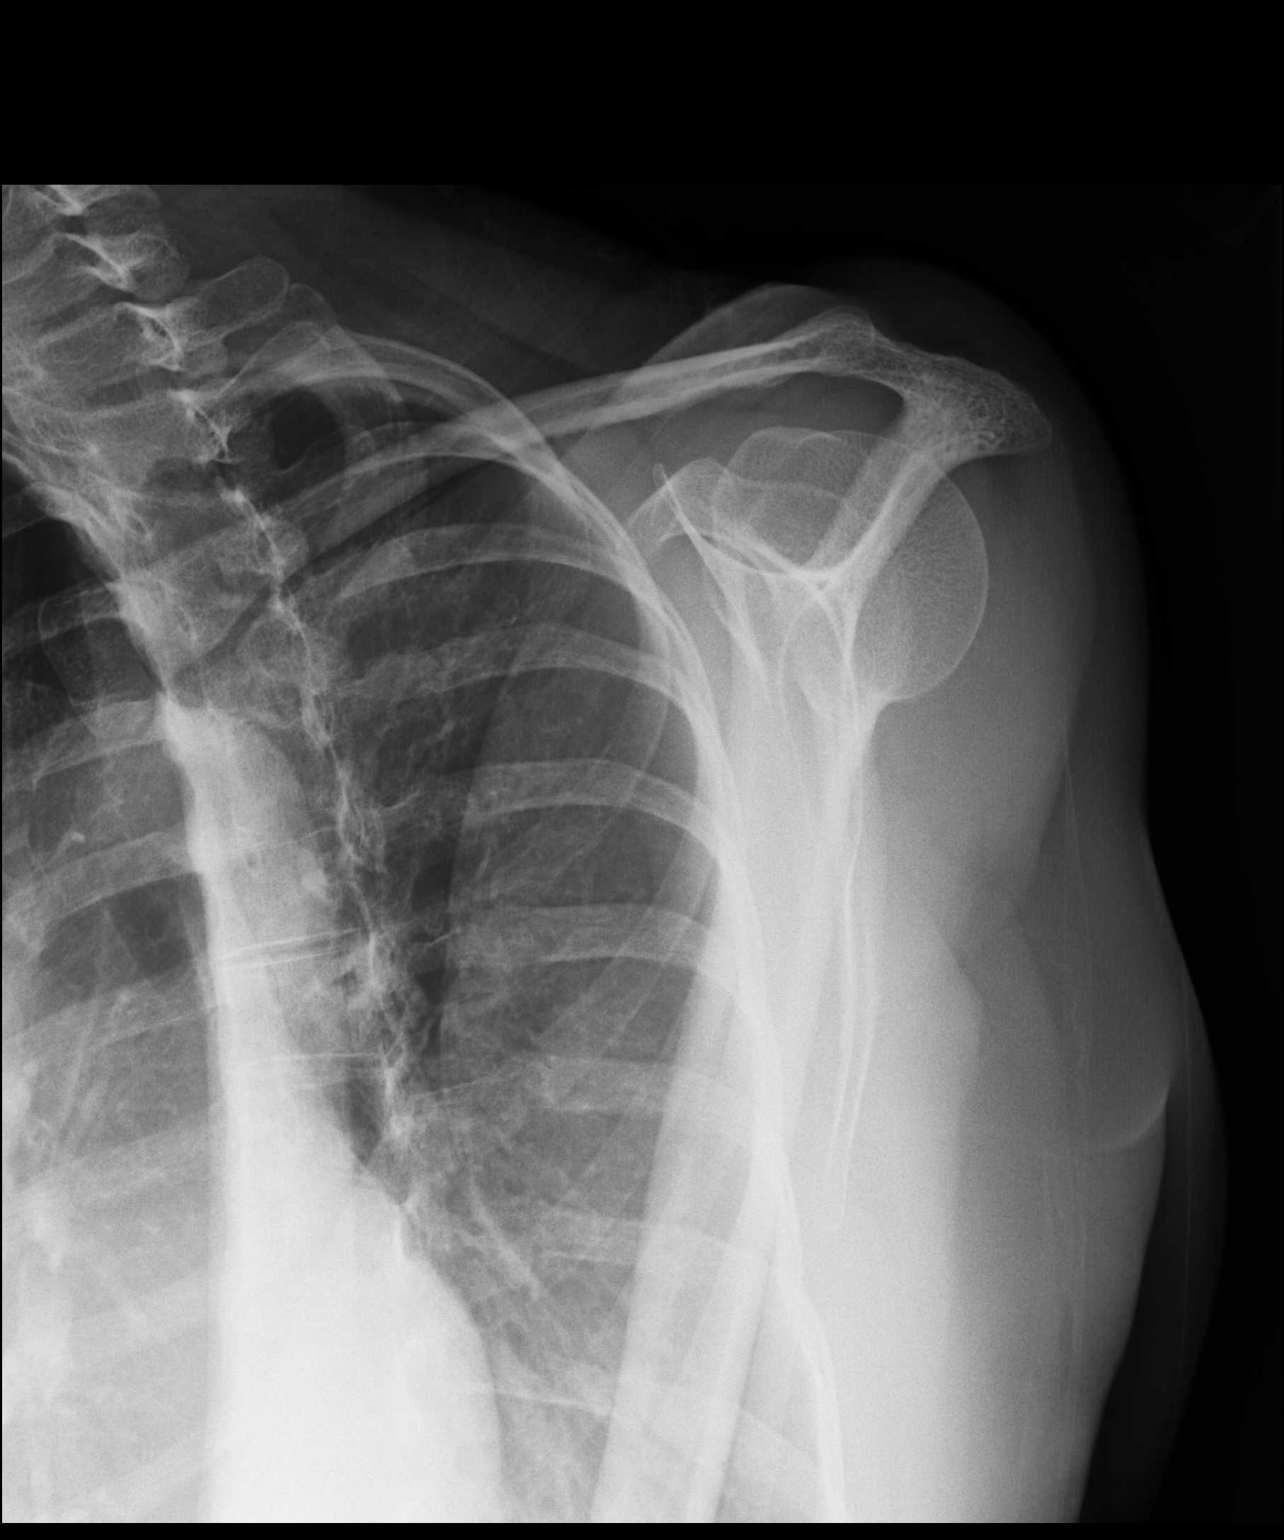

[dg shoulder left (3 of 3)]
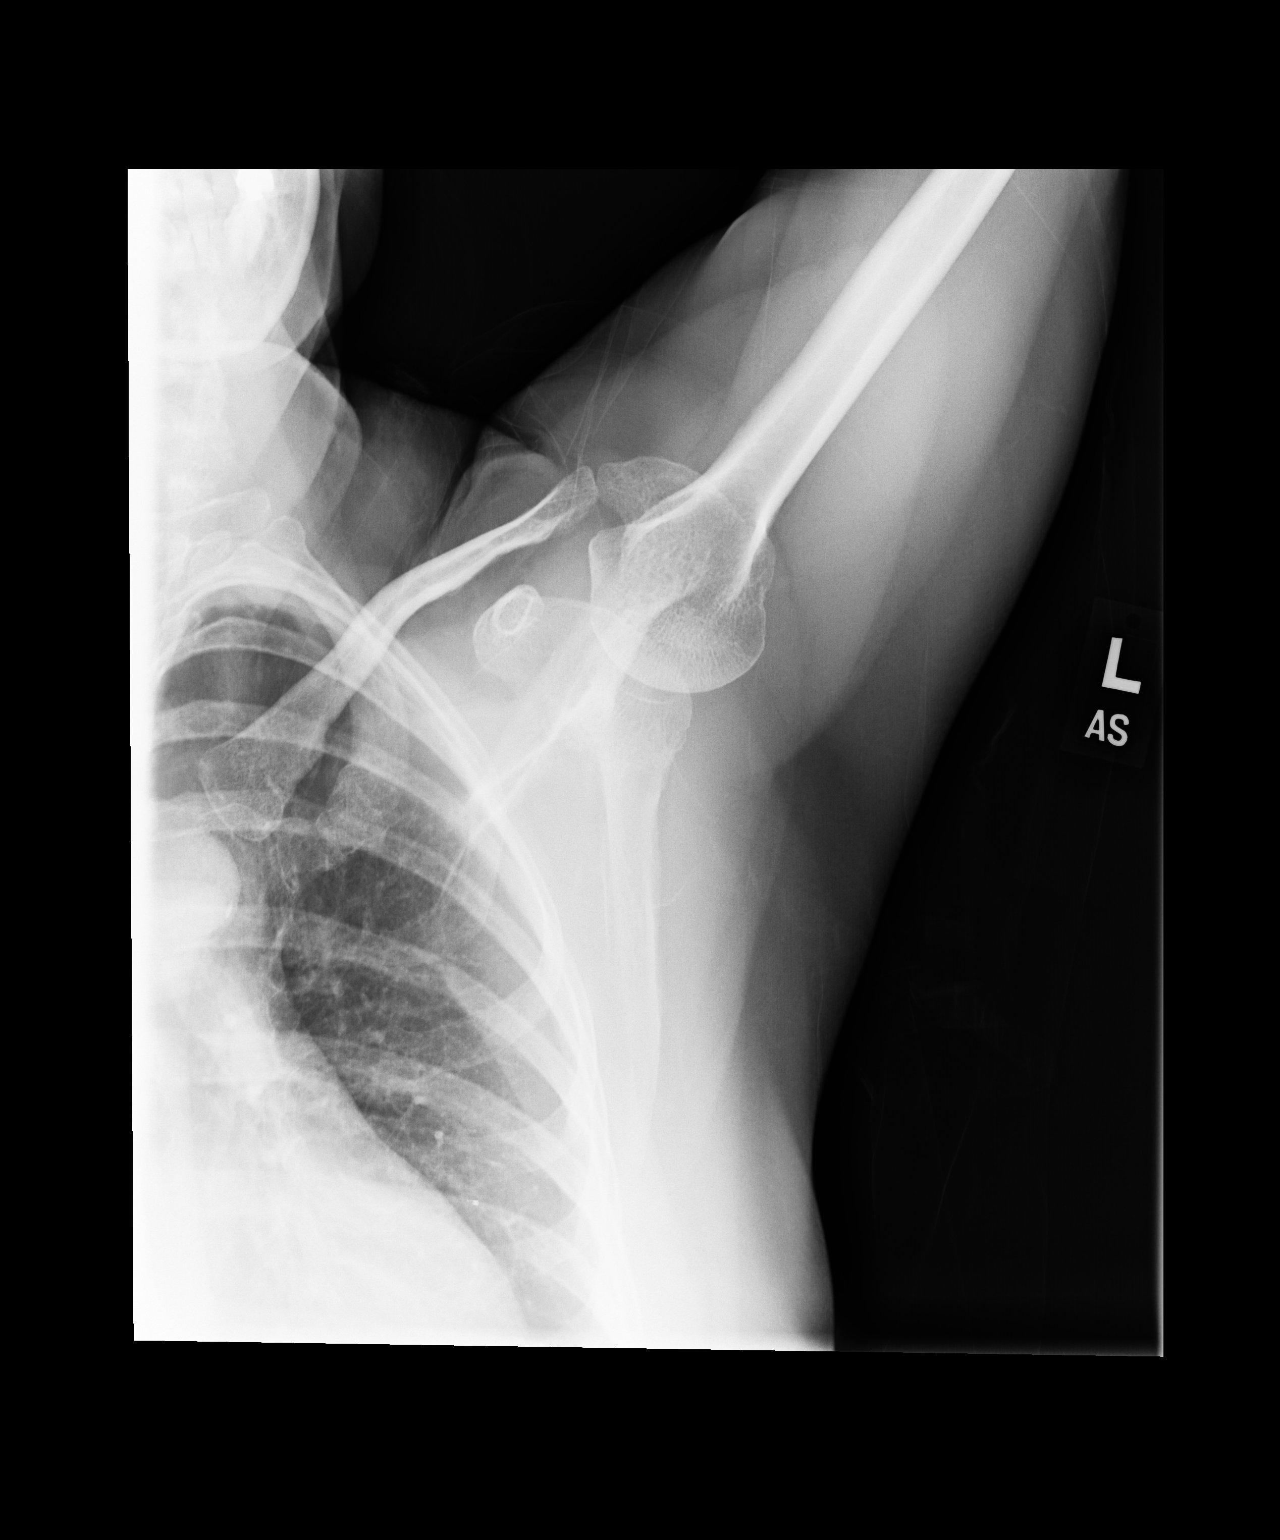

[3 of 3 positions shown; findings below may reference images not displayed]

FINDINGS: There is no evidence of fracture or dislocation. There is no
evidence of arthropathy or other focal bone abnormality. Soft
tissues are unremarkable.
IMPRESSION: Negative.

## 2023-01-28 ENCOUNTER — Encounter: Payer: Self-pay | Admitting: Neurology

## 2023-01-28 ENCOUNTER — Ambulatory Visit: Payer: BC Managed Care – PPO | Admitting: Neurology

## 2023-01-28 VITALS — BP 124/86 | HR 63 | Ht 62.0 in | Wt 129.5 lb

## 2023-01-28 DIAGNOSIS — M2560 Stiffness of unspecified joint, not elsewhere classified: Secondary | ICD-10-CM | POA: Diagnosis not present

## 2023-01-28 DIAGNOSIS — R292 Abnormal reflex: Secondary | ICD-10-CM | POA: Diagnosis not present

## 2023-01-28 NOTE — Progress Notes (Signed)
Chief Complaint  Patient presents with   New Patient (Initial Visit)    Rm13, alone Myalgias not localized, bilateral arm weakness: ongoing 15 years, intermittent, worse at times      ASSESSMENT AND PLAN  Lacey Morse is a 57 y.o. female   Bilateral upper extremity intermittent deep achy pain, weakness,  Hyperreflexia on examination, bilateral Babinski signs and Hoffmann signs,  MRI of cervical spine to rule out cervical spondylitic myelopathy  Laboratory evaluation for inflammatory markers  DIAGNOSTIC DATA (LABS, IMAGING, TESTING) - I reviewed patient records, labs, notes, testing and imaging myself where available.   MEDICAL HISTORY:  Lacey Morse is a 57 year old female, seen in request by her primary care physician from [facility] Dr. Jacqulyn Bath, [place] for evaluation of bilateral upper extremity achy pain, weakness, initial evaluation was on January 28, 2023    I reviewed and summarized the referring note. PMHx. GERD  She began to notice intermittent bilateral hands muscle achy, weakness more than a decade ago, was seen by orthopedic surgeon, was told to have wrist ganglion cyst bilaterally, offered surgical option if needed, symptoms gradually improved,  Around 2021, she began to notice recurrent bilateral hand achy pain, especially when holding heavy objects or downwards, later moving up to forearm, and shoulder region, was treated with NSAIDs, physical therapy, which helped her symptoms some  But by the beginning of 2024, she had recurrent symptoms, she noticed achiness of bilateral upper extremity from shoulder down, difficulty opening her jaw, denied persistent sensory loss, denies neck pain no gait abnormality no bowel or bladder incontinence  She does exercise regularly, including treadmill and some weight lifting without problems  Today's examination find hyper reflexia of both upper and lower extremity, bilateral Hoffmann and Babinski signs,  Laboratory evaluation from  [facility] physician April 2024 showed normal CPK 104 folic acid, CMP, TSH, CBC, Z61 870,  PHYSICAL EXAM:   Vitals:   01/28/23 1622  BP: 124/86  Pulse: 63  Weight: 129 lb 8 oz (58.7 kg)  Height: 5\' 2"  (1.575 m)   Body mass index is 23.69 kg/m.  PHYSICAL EXAMNIATION:  Gen: NAD, conversant, well nourised, well groomed                     Cardiovascular: Regular rate rhythm, no peripheral edema, warm, nontender. Eyes: Conjunctivae clear without exudates or hemorrhage Neck: Supple, no carotid bruits. Pulmonary: Clear to auscultation bilaterally   NEUROLOGICAL EXAM:  MENTAL STATUS: Speech/cognition: Awake, alert, oriented to history taking and casual conversation CRANIAL NERVES: CN II: Visual fields are full to confrontation. Pupils are round equal and briskly reactive to light. CN III, IV, VI: extraocular movement are normal. No ptosis. CN V: Facial sensation is intact to light touch CN VII: Face is symmetric with normal eye closure  CN VIII: Hearing is normal to causal conversation. CN IX, X: Phonation is normal. CN XI: Head turning and shoulder shrug are intact  MOTOR: There is no pronator drift of out-stretched arms. Muscle bulk and tone are normal. Muscle strength is normal.  REFLEXES: Reflexes are 3 and symmetric at the biceps, triceps, knees, and ankles. Plantar responses are extensor bilaterally, bilateral Hoffmann sign  SENSORY: Intact to light touch, pinprick and vibratory sensation are intact in fingers and toes.  COORDINATION: There is no trunk or limb dysmetria noted.  GAIT/STANCE: Posture is normal. Gait is steady with normal steps, base, arm swing, and turning. Heel and toe walking are normal. Tandem gait is normal.  Romberg is absent.  REVIEW OF SYSTEMS:  Full 14 system review of systems performed and notable only for as above All other review of systems were negative.   ALLERGIES: Allergies  Allergen Reactions   Peanut Oil Other (See Comments)     Mouth ulcers    HOME MEDICATIONS: Current Outpatient Medications  Medication Sig Dispense Refill   famotidine (PEPCID) 20 MG tablet Take 20 mg by mouth daily.     No current facility-administered medications for this visit.    PAST MEDICAL HISTORY: Past Medical History:  Diagnosis Date   GERD (gastroesophageal reflux disease)     PAST SURGICAL HISTORY: Past Surgical History:  Procedure Laterality Date   HERNIA REPAIR     IMPACTED THIRD MOLAR REMOVAL Left 2022   top   WISDOM TOOTH EXTRACTION Left 2021   top    FAMILY HISTORY: History reviewed. No pertinent family history.  SOCIAL HISTORY: Social History   Socioeconomic History   Marital status: Married    Spouse name: Automotive engineer   Number of children: 2   Years of education: 14   Highest education level: Associate degree: academic program  Occupational History   Not on file  Tobacco Use   Smoking status: Never   Smokeless tobacco: Never  Vaping Use   Vaping Use: Never used  Substance and Sexual Activity   Alcohol use: Never   Drug use: Never   Sexual activity: Yes    Birth control/protection: Condom  Other Topics Concern   Not on file  Social History Narrative   Not on file   Social Determinants of Health   Financial Resource Strain: Not on file  Food Insecurity: Not on file  Transportation Needs: Not on file  Physical Activity: Not on file  Stress: Not on file  Social Connections: Not on file  Intimate Partner Violence: Not on file      Levert Feinstein, M.D. Ph.D.  [facility] [address] [place], [place] [address] Ph: [phone] Fax: [phone]  CC:  Ollen Bowl, MD [address] [place],  [place] [address]  Maurice Small, MD (Inactive)

## 2023-01-29 LAB — SEDIMENTATION RATE: Sed Rate: 31 mm/hr (ref 0–40)

## 2023-01-29 LAB — C-REACTIVE PROTEIN: CRP: 3 mg/L (ref 0–10)

## 2023-01-29 LAB — ANA W/REFLEX: Anti Nuclear Antibody (ANA): NEGATIVE

## 2023-02-02 ENCOUNTER — Telehealth: Payer: Self-pay | Admitting: Neurology

## 2023-02-02 NOTE — Telephone Encounter (Signed)
Call to patient, no answer,left message to call.
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
## 2023-02-03 ENCOUNTER — Other Ambulatory Visit: Payer: Self-pay

## 2023-02-03 DIAGNOSIS — S0990XS Unspecified injury of head, sequela: Secondary | ICD-10-CM

## 2023-02-03 DIAGNOSIS — R292 Abnormal reflex: Secondary | ICD-10-CM

## 2023-02-03 DIAGNOSIS — M2560 Stiffness of unspecified joint, not elsewhere classified: Secondary | ICD-10-CM

## 2023-02-03 DIAGNOSIS — S0990XA Unspecified injury of head, initial encounter: Secondary | ICD-10-CM | POA: Insufficient documentation
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
## 2023-02-05 ENCOUNTER — Ambulatory Visit
Admission: RE | Admit: 2023-02-05 | Discharge: 2023-02-05 | Disposition: A | Discharge status: Home / Self Care | Payer: BC Managed Care – PPO | Source: Ambulatory Visit | Attending: Neurology | Admitting: Neurology

## 2023-02-05 DIAGNOSIS — S0990XS Unspecified injury of head, sequela: Secondary | ICD-10-CM

## 2023-02-09 ENCOUNTER — Telehealth: Payer: Self-pay | Admitting: *Deleted

## 2023-02-09 ENCOUNTER — Ambulatory Visit (INDEPENDENT_AMBULATORY_CARE_PROVIDER_SITE_OTHER): Payer: BC Managed Care – PPO

## 2023-02-09 DIAGNOSIS — R292 Abnormal reflex: Secondary | ICD-10-CM

## 2023-02-09 DIAGNOSIS — M2560 Stiffness of unspecified joint, not elsewhere classified: Secondary | ICD-10-CM

## 2023-02-09 NOTE — Telephone Encounter (Signed)
Pt here for MRI cervical (Dr. Terrace Arabia).  [facility] has not read MR orbits yet.  Dr. Epimenio Foot was able to look at the images and relayed looked good. Ok to proceed with MRI cervical.   Relayed to MRI tech she verbalized understanding.

## 2023-03-01 ENCOUNTER — Ambulatory Visit: Payer: BC Managed Care – PPO | Admitting: Neurology

## 2023-04-05 ENCOUNTER — Ambulatory Visit: Payer: BC Managed Care – PPO | Admitting: Neurology

## 2023-04-05 ENCOUNTER — Encounter: Payer: Self-pay | Admitting: Neurology

## 2023-04-05 VITALS — BP 127/85 | HR 94 | Resp 15 | Ht 61.5 in | Wt 130.0 lb

## 2023-04-05 DIAGNOSIS — M2560 Stiffness of unspecified joint, not elsewhere classified: Secondary | ICD-10-CM | POA: Diagnosis not present

## 2023-04-05 DIAGNOSIS — R292 Abnormal reflex: Secondary | ICD-10-CM

## 2023-04-05 MED ORDER — DULOXETINE HCL 60 MG PO CPEP: 60.0000 mg | ORAL_CAPSULE | Freq: Every day | ORAL | 11 refills | Status: AC

## 2023-04-05 MED ORDER — DULOXETINE HCL 30 MG PO CPEP: 30.0000 mg | ORAL_CAPSULE | Freq: Every day | ORAL | 0 refills | Status: AC

## 2023-04-05 NOTE — Progress Notes (Signed)
Chief Complaint  Patient presents with   Follow-up    Rm14, alone Mri f/up: pt stated that this is in regards to the unremarkable mri. She stated arms/hands progressively hurt upon pressure. Pain is constant, worse at times.       ASSESSMENT AND PLAN  Lacey Morse is a 57 y.o. female   Bilateral upper extremity intermittent deep achy pain, subjective weakness,  Essentially normal neurological examination other than brisk upper and lower extremity reflexes, MRI of cervical spine showed mild degenerative changes no cord and nerve root compression,  Will try Cymbalta 30 mg titrating to 60 mg  No further neurological evaluation is needed at this point  DIAGNOSTIC DATA (LABS, IMAGING, TESTING) - I reviewed patient records, labs, notes, testing and imaging myself where available.   MEDICAL HISTORY:  Lacey Morse is a 57 year old female, seen in request by her primary care physician from [facility] Dr. Jacqulyn Bath, [place] for evaluation of bilateral upper extremity achy pain, weakness, initial evaluation was on January 28, 2023   I reviewed and summarized the referring note. PMHx. GERD  She began to notice intermittent bilateral hands muscle achy, weakness more than a decade ago, was seen by orthopedic surgeon, was told to have wrist ganglion cyst bilaterally, offered surgical option if needed, symptoms gradually improved,  Around 2021, she began to notice recurrent bilateral hand achy pain, especially when holding heavy objects or downwards, later moving up to forearm, and shoulder region, was treated with NSAIDs, physical therapy, which helped her symptoms some  But by the beginning of 2024, she had recurrent symptoms, she noticed achiness of bilateral upper extremity from shoulder down, difficulty opening her jaw, denied persistent sensory loss, denies neck pain no gait abnormality no bowel or bladder incontinence  She does exercise regularly, including treadmill and some weight lifting  without problems  Today's examination find hyper reflexia of both upper and lower extremity, bilateral Hoffmann and Babinski signs,  Laboratory evaluation from [facility] physician April 2024 showed normal CPK 104 folic acid, CMP, TSH, CBC, B14 870,  UPDATE August 26th 2024: She continue complains of almost constant upper extremity and bilateral hands deep achy pain, have to limit her activity, denied lower extremity pain denied gait abnormality  She denies bowel bladder incontinence  MRI of cervical spine showed mild degenerative changes no evidence of cord or nerve root compression  Laboratory evaluation showed normal ESR, C-reactive protein, ANA  PHYSICAL EXAM:   Vitals:   04/05/23 1457  BP: 127/85  Pulse: 94  Resp: 15  Weight: 130 lb (59 kg)  Height: 5' 1.5" (1.562 m)   Body mass index is 24.17 kg/m.  PHYSICAL EXAMNIATION:  Gen: NAD, conversant, well nourised, well groomed                     Cardiovascular: Regular rate rhythm, no peripheral edema, warm, nontender. Eyes: Conjunctivae clear without exudates or hemorrhage Neck: Supple, no carotid bruits. Pulmonary: Clear to auscultation bilaterally   NEUROLOGICAL EXAM:  MENTAL STATUS: Speech/cognition: Awake, alert, oriented to history taking and casual conversation CRANIAL NERVES: CN II: Visual fields are full to confrontation. Pupils are round equal and briskly reactive to light. CN III, IV, VI: extraocular movement are normal. No ptosis. CN V: Facial sensation is intact to light touch CN VII: Face is symmetric with normal eye closure  CN VIII: Hearing is normal to causal conversation. CN IX, X: Phonation is normal. CN XI: Head turning and shoulder shrug are intact  MOTOR: There is no pronator drift of out-stretched arms. Muscle bulk and tone are normal. Muscle strength is normal.  REFLEXES: Reflexes are 3 and symmetric at the biceps, triceps, knees, and ankles. Plantar responses are flexor  bilaterally SENSORY: Intact to light touch, pinprick and vibratory sensation are intact in fingers and toes.  COORDINATION: There is no trunk or limb dysmetria noted.  GAIT/STANCE: Posture is normal. Gait is steady with normal steps, base, arm swing, and turning. Heel and toe walking are normal. Tandem gait is normal.  Romberg is absent.  REVIEW OF SYSTEMS:  Full 14 system review of systems performed and notable only for as above All other review of systems were negative.   ALLERGIES: Allergies  Allergen Reactions   Peanut Oil Other (See Comments)    Mouth ulcers    HOME MEDICATIONS: Current Outpatient Medications  Medication Sig Dispense Refill   famotidine (PEPCID) 20 MG tablet Take 20 mg by mouth daily.     No current facility-administered medications for this visit.    PAST MEDICAL HISTORY: Past Medical History:  Diagnosis Date   GERD (gastroesophageal reflux disease)     PAST SURGICAL HISTORY: Past Surgical History:  Procedure Laterality Date   HERNIA REPAIR     IMPACTED THIRD MOLAR REMOVAL Left 2022   top   WISDOM TOOTH EXTRACTION Left 2021   top    FAMILY HISTORY: History reviewed. No pertinent family history.  SOCIAL HISTORY: Social History   Socioeconomic History   Marital status: Married    Spouse name: Automotive engineer   Number of children: 2   Years of education: 14   Highest education level: Associate degree: academic program  Occupational History   Not on file  Tobacco Use   Smoking status: Never   Smokeless tobacco: Never  Vaping Use   Vaping status: Never Used  Substance and Sexual Activity   Alcohol use: Never   Drug use: Never   Sexual activity: Yes    Birth control/protection: Condom  Other Topics Concern   Not on file  Social History Narrative   Not on file   Social Determinants of Health   Financial Resource Strain: Not on file  Food Insecurity: Not on file  Transportation Needs: Not on file  Physical Activity: Not on  file  Stress: Not on file  Social Connections: Not on file  Intimate Partner Violence: Not on file      Levert Feinstein, M.D. Ph.D.  [facility] [address] [place], [place] [address] Ph: [phone] Fax: [phone]  CC:  No referring provider defined for this encounter.  Maurice Small, MD (Inactive)
